# Patient Record
Sex: Male | Born: 1950 | Race: Black or African American | Hispanic: No | Marital: Married | Smoking: Never smoker
Health system: Southern US, Community
[De-identification: ages and names within clinical notes are randomized; demographics above are authoritative.]

---

## 2020-02-23 ENCOUNTER — Ambulatory Visit (INDEPENDENT_AMBULATORY_CARE_PROVIDER_SITE_OTHER): Payer: Medicare Other | Admitting: Internal Medicine

## 2020-02-23 ENCOUNTER — Encounter: Payer: Self-pay | Admitting: Internal Medicine

## 2020-02-23 VITALS — BP 110/68 | HR 72 | Resp 12 | Ht 66.5 in | Wt 133.0 lb

## 2020-02-23 DIAGNOSIS — K029 Dental caries, unspecified: Secondary | ICD-10-CM | POA: Diagnosis not present

## 2020-02-23 NOTE — Progress Notes (Signed)
Subjective:    Patient ID: Connor Wade, male   DOB: Nov 17, 1950, 69 y.o.   MRN: [id number]   HPI   Here to establish  1.  Needs tooth pulled in right mandible.  States it is broken off.    2.  HM:  No pneumococcal vaccine yet.  Td due next year.    3.  MJ use:  Does not smoke cigarettes, though history states he does.  Smokes MJ twice monthly. Refuses to consider edibles.    No outpatient medications have been marked as taking for the 02/23/20 encounter (Office Visit) with Julieanne Manson, MD.   No Known Allergies   No past medical history on file.   History reviewed. No pertinent surgical history.  No family history on file.  Family Status  Relation Name Status  . Mother  Deceased at age 38       Murdered due to "jealousy"  Sounds like by previous partner  . Father  Deceased at age 33       Not clear of cause--relatively sudden.  . Sister  Alive  . Daughter  Alive, age 45y  . Sister  Alive   Later shares 4 siblings have died, not able to give causes of death   Social History   Socioeconomic History  . Marital status: Married    Spouse name: Lesia Sago  . Number of children: 1  . Years of education: 66  . Highest education level: Some college, no degree  Occupational History  . Not on file  Tobacco Use  . Smoking status: Never Smoker  . Smokeless tobacco: Never Used  Substance and Sexual Activity  . Alcohol use: Not Currently  . Drug use: Yes    Types: Marijuana    Comment: twice monthly  . Sexual activity: Not on file  Other Topics Concern  . Not on file  Social History Narrative   Lives with wife at home.  They both have children from previous relationships.   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 Difficulty of Paying Living Expenses: Not hard at all  Food Insecurity: No Food Insecurity  . Worried About Programme researcher, broadcasting/film/video in  the Last Year: Never true  . Ran Out of Food in the Last Year: Never true  Transportation Needs: No Transportation Needs  . Lack of Transportation (Medical): No  . Lack of Transportation (Non-Medical): No  Physical Activity:   . Days of Exercise per Week:   . Minutes of Exercise per Session:   Stress: No Stress Concern Present  . Feeling of Stress : Not at all  Social Connections: Moderately Integrated  . Frequency of Communication with Friends and Family: Three times a week  . Frequency of Social Gatherings with Friends and Family: Once a week  . Attends Religious Services: More than 4 times per year  . Active Member of Clubs or Organizations: No  . Attends Banker Meetings: Never  . Marital Status: Married  Catering manager Violence: Not At Risk  . Fear of Current or Ex-Partner: No  . Emotionally Abused: No  . Physically Abused: No  . Sexually Abused: No         Review of Systems    Objective:   BP 110/68 (BP Location: Left Arm, Patient Position: Sitting, Cuff Size: Normal)   Pulse 72  Resp 12   Ht 5' 6.5" (1.689 m)   Wt 133 lb (60.3 kg)   BMI 21.15 kg/m   Physical Exam   NAD HEENT:  PERRL, EOMI, TMs pearly gray, throat without injection.  Dental decay with right mandibular molar broken off and decayed.  No surrounding erythema or swelling. Neck:  Supple, no adenopathy, no thryomegaly Chest:  CTA CV:  RRR with normal S1 and S2, No S3, S4 or murmur.  No carotid bruits.  Carotid, radial and DP pulses normal and equal Abd:  S, NT, No HSM or mass, + BS LE:  No edema.   Assessment & Plan   1.  Dental Decay:  Gave options for lower cost dentistry.    2.  HM:  To return in next week for fasting labs:  FLP, CBC, CMP, PSA and to get started with COVID vaccination.  Hold on pneumococcal vaccination until COVID complete for 2 weeks. With me in 3 months for CPE.

## 2020-02-28 ENCOUNTER — Other Ambulatory Visit: Payer: Medicare Other

## 2020-04-25 ENCOUNTER — Encounter: Payer: Self-pay | Admitting: Internal Medicine

## 2020-04-25 ENCOUNTER — Telehealth: Payer: Self-pay | Admitting: Internal Medicine

## 2020-04-25 DIAGNOSIS — K029 Dental caries, unspecified: Secondary | ICD-10-CM | POA: Insufficient documentation

## 2020-04-25 NOTE — Telephone Encounter (Signed)
Lvm for patient return call 04/25/2020

## 2020-04-26 NOTE — Telephone Encounter (Signed)
Patient scheduled for lab appointment on 05/03/2020@ 10:30 am. Patient stated has been vaccinated for COVID19 here at the clinic 03/20/20 , 04/17/20.

## 2020-05-03 ENCOUNTER — Other Ambulatory Visit: Payer: Medicare Other

## 2020-05-06 ENCOUNTER — Emergency Department (HOSPITAL_COMMUNITY)
Admission: EM | Admit: 2020-05-06 | Discharge: 2020-05-07 | Disposition: A | Discharge status: Home / Self Care | Payer: Medicare Other | Attending: Emergency Medicine | Admitting: Emergency Medicine

## 2020-05-06 ENCOUNTER — Encounter (HOSPITAL_COMMUNITY): Payer: Self-pay | Admitting: Emergency Medicine

## 2020-05-06 DIAGNOSIS — Y929 Unspecified place or not applicable: Secondary | ICD-10-CM | POA: Diagnosis not present

## 2020-05-06 DIAGNOSIS — Y939 Activity, unspecified: Secondary | ICD-10-CM | POA: Diagnosis not present

## 2020-05-06 DIAGNOSIS — S01311A Laceration without foreign body of right ear, initial encounter: Secondary | ICD-10-CM | POA: Diagnosis not present

## 2020-05-06 DIAGNOSIS — Y999 Unspecified external cause status: Secondary | ICD-10-CM | POA: Insufficient documentation

## 2020-05-06 DIAGNOSIS — T07XXXA Unspecified multiple injuries, initial encounter: Secondary | ICD-10-CM

## 2020-05-06 DIAGNOSIS — S0993XA Unspecified injury of face, initial encounter: Secondary | ICD-10-CM | POA: Diagnosis present
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
## 2020-05-07 ENCOUNTER — Emergency Department (HOSPITAL_COMMUNITY): Payer: Medicare Other

## 2020-05-07 NOTE — ED Notes (Signed)
Patient verbalizes understanding of discharge instructions. Opportunity for questioning and answers were provided. Pt discharged from ED. 
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
## 2020-05-07 NOTE — Discharge Instructions (Signed)
Wound care to the right posterior earlobe laceration wash that daily with soap and water and put antibiotic ointment on it twice a day.  CT of head and neck and chest x-ray and the right shoulder without any significant abnormalities.  Expect to be sore sore and stiff the next few days.  Work note provided to be out of work until Wednesday.  Return for any new or worse symptoms.

## 2020-05-31 ENCOUNTER — Encounter: Payer: Medicare Other | Admitting: Internal Medicine
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
## 2021-05-17 ENCOUNTER — Other Ambulatory Visit: Payer: Self-pay | Admitting: Nurse Practitioner

## 2023-05-20 ENCOUNTER — Emergency Department (HOSPITAL_COMMUNITY)
Admission: EM | Admit: 2023-05-20 | Discharge: 2023-05-20 | Disposition: A | Discharge status: Home / Self Care | Payer: Medicare PPO | Attending: Emergency Medicine | Admitting: Emergency Medicine

## 2023-05-20 ENCOUNTER — Other Ambulatory Visit: Payer: Self-pay

## 2023-05-20 ENCOUNTER — Emergency Department (HOSPITAL_COMMUNITY): Payer: Medicare PPO

## 2023-05-20 DIAGNOSIS — Y92410 Unspecified street and highway as the place of occurrence of the external cause: Secondary | ICD-10-CM | POA: Diagnosis not present

## 2023-05-20 DIAGNOSIS — M25512 Pain in left shoulder: Secondary | ICD-10-CM | POA: Diagnosis present

## 2023-05-20 DIAGNOSIS — M75102 Unspecified rotator cuff tear or rupture of left shoulder, not specified as traumatic: Secondary | ICD-10-CM | POA: Diagnosis not present

## 2023-05-20 DIAGNOSIS — M67912 Unspecified disorder of synovium and tendon, left shoulder: Secondary | ICD-10-CM
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
## 2023-05-20 NOTE — Discharge Instructions (Signed)
Based on your exam today, you likely have a rotator cuff injury.  I have provided you with a sling that you can wear.  Included in your discharge paperwork is the telephone number for an orthopedic doctor.  You should call them this week to set up a follow-up appointment.  You can take Tylenol and ibuprofen at home.  Regarding work, you should avoid any lifting greater than 15 pounds until you are evaluated by an orthopedic doctor.

## 2023-06-04 ENCOUNTER — Encounter: Payer: Self-pay | Admitting: Orthopedic Surgery

## 2023-06-04 ENCOUNTER — Other Ambulatory Visit (INDEPENDENT_AMBULATORY_CARE_PROVIDER_SITE_OTHER): Payer: No Typology Code available for payment source

## 2023-06-04 ENCOUNTER — Ambulatory Visit (INDEPENDENT_AMBULATORY_CARE_PROVIDER_SITE_OTHER): Payer: No Typology Code available for payment source | Admitting: Orthopedic Surgery

## 2023-06-04 DIAGNOSIS — M5412 Radiculopathy, cervical region: Secondary | ICD-10-CM

## 2023-06-06 ENCOUNTER — Encounter: Payer: Self-pay | Admitting: Orthopedic Surgery
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
## 2023-06-12 ENCOUNTER — Telehealth: Payer: Self-pay

## 2023-06-12 DIAGNOSIS — M5412 Radiculopathy, cervical region: Secondary | ICD-10-CM

## 2023-06-12 NOTE — Telephone Encounter (Signed)
Pls advise. Work comp questions why you are ordering imaging of neck. Also could patient return to work sedentary duty?
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
## 2023-06-12 NOTE — Telephone Encounter (Signed)
I think it is most likely that his left arm problem is coming from his neck.  His shoulder exam is fairly underwhelming.  I would say scan both if they want the quickest resolution to this problem.  And we can do the left shoulder as a nonarthrogram study if needed.

## 2023-06-13 NOTE — Addendum Note (Signed)
Addended by: Barbette Or on: 06/13/2023 10:27 AM   Modules accepted: Orders

## 2023-06-27 ENCOUNTER — Encounter: Payer: Self-pay | Admitting: Orthopedic Surgery

## 2023-07-04 ENCOUNTER — Ambulatory Visit
Admission: RE | Admit: 2023-07-04 | Discharge: 2023-07-04 | Disposition: A | Discharge status: Home / Self Care | Payer: Medicare PPO | Source: Ambulatory Visit | Attending: Orthopedic Surgery | Admitting: Orthopedic Surgery

## 2023-07-04 DIAGNOSIS — M5412 Radiculopathy, cervical region: Secondary | ICD-10-CM

## 2023-07-09 ENCOUNTER — Telehealth: Payer: Self-pay | Admitting: Orthopedic Surgery

## 2023-07-09 NOTE — Telephone Encounter (Signed)
Called pt LVM to CB and schedule MRI review with August Saucer and next available pt had MRI on 10/11

## 2023-07-16 ENCOUNTER — Other Ambulatory Visit: Payer: Self-pay

## 2023-07-16 ENCOUNTER — Telehealth: Payer: Self-pay

## 2023-07-16 ENCOUNTER — Ambulatory Visit (INDEPENDENT_AMBULATORY_CARE_PROVIDER_SITE_OTHER): Payer: Medicare PPO | Admitting: Orthopedic Surgery

## 2023-07-16 DIAGNOSIS — M19212 Secondary osteoarthritis, left shoulder: Secondary | ICD-10-CM

## 2023-07-16 DIAGNOSIS — M5412 Radiculopathy, cervical region: Secondary | ICD-10-CM

## 2023-07-16 DIAGNOSIS — M25512 Pain in left shoulder: Secondary | ICD-10-CM | POA: Diagnosis not present

## 2023-07-16 NOTE — Telephone Encounter (Signed)
Auth needed from work comp for cervical [employer]

## 2023-07-18 ENCOUNTER — Encounter: Payer: Self-pay | Admitting: Orthopedic Surgery

## 2023-07-18 MED ORDER — LIDOCAINE HCL 1 % IJ SOLN
5.0000 mL | INTRAMUSCULAR | Status: AC | PRN
  Administered 2023-07-16: 5 mL

## 2023-07-18 MED ORDER — METHYLPREDNISOLONE ACETATE 40 MG/ML IJ SUSP
40.0000 mg | INTRAMUSCULAR | Status: AC | PRN
  Administered 2023-07-16: 40 mg via INTRA_ARTICULAR

## 2023-07-18 MED ORDER — BUPIVACAINE HCL 0.5 % IJ SOLN
9.0000 mL | INTRAMUSCULAR | Status: AC | PRN
  Administered 2023-07-16: 9 mL via INTRA_ARTICULAR
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
## 2023-08-27 ENCOUNTER — Ambulatory Visit: Payer: Medicare PPO | Admitting: Orthopedic Surgery

## 2023-09-11 ENCOUNTER — Telehealth: Payer: Self-pay

## 2023-09-11 NOTE — Telephone Encounter (Signed)
Pls advise.  

## 2023-09-11 NOTE — Telephone Encounter (Signed)
-----   Message from [place] Z sent at 09/11/2023  3:20 PM EST ----- Regarding: Secure   Injection referral with Dr. Alvester Morin  [facility] [facility] Dr. August Saucer and Leotis Shames  I received email today from [facility] with Lucienne Capers stating that Mr. Leftridge did not want the injection with Dr. Alvester Morin, MD regarding the [facility]. The WC rep., stated Mr. Mcconnell just wants to be released. Do I need to schedule another appointment with you in order to have you release him. I know Dr. Vienna Blas staff called several times to try and get him scheduled for this injection without success. Mr. Cohea was seen last by you on 07/16/23 review the MRI regarding left shoulder.  See attachment below from [facility].  Secure  WC    Ardelle Balls    CLAIM: [id number]    DOI: [id number]      Re: Lt Shoulder [email or web address]  I called him and he states he does not want to do the injection, just wants to be released.  Can you ask MD about doing this?  Thank you    Norlene Campbell RN, CCM (she/her) Senior Medical Case Manager P [phone]  F [phone] E [email or web address] [employer] [address] [place], [place]   [address]

## 2023-09-15 NOTE — Telephone Encounter (Signed)
Needs follow up appointment.  

## 2023-10-15 ENCOUNTER — Ambulatory Visit: Payer: Medicare PPO | Admitting: Orthopedic Surgery

## 2023-10-30 ENCOUNTER — Ambulatory Visit: Payer: Medicare PPO | Admitting: Orthopedic Surgery

## 2023-11-05 ENCOUNTER — Ambulatory Visit (INDEPENDENT_AMBULATORY_CARE_PROVIDER_SITE_OTHER): Payer: No Typology Code available for payment source | Admitting: Orthopedic Surgery

## 2023-11-05 DIAGNOSIS — M5412 Radiculopathy, cervical region: Secondary | ICD-10-CM | POA: Diagnosis not present

## 2023-11-07 ENCOUNTER — Encounter: Payer: Self-pay | Admitting: Orthopedic Surgery
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
## 2024-04-29 ENCOUNTER — Ambulatory Visit: Admitting: Orthopedic Surgery

## 2024-04-29 DIAGNOSIS — M5412 Radiculopathy, cervical region: Secondary | ICD-10-CM | POA: Diagnosis not present

## 2024-04-30 ENCOUNTER — Encounter: Payer: Self-pay | Admitting: Orthopedic Surgery
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
